# Patient Record
Sex: Female | Born: 2000
Health system: Southern US, Community
[De-identification: ages and names within clinical notes are randomized; demographics above are authoritative.]

## PROBLEM LIST (undated history)

## (undated) HISTORY — PX: MOUTH SURGERY: SHX715

---
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
## 2018-03-02 DIAGNOSIS — Z3042 Encounter for surveillance of injectable contraceptive: Secondary | ICD-10-CM | POA: Diagnosis not present

## 2018-03-02 DIAGNOSIS — Z3049 Encounter for surveillance of other contraceptives: Secondary | ICD-10-CM | POA: Diagnosis not present

## 2018-12-03 ENCOUNTER — Emergency Department: Payer: BC Managed Care – PPO

## 2018-12-03 ENCOUNTER — Emergency Department
Admission: EM | Admit: 2018-12-03 | Discharge: 2018-12-03 | Disposition: A | Discharge status: Home / Self Care | Payer: BC Managed Care – PPO | Attending: Emergency Medicine | Admitting: Emergency Medicine

## 2018-12-03 ENCOUNTER — Encounter: Payer: Self-pay | Admitting: Emergency Medicine

## 2018-12-03 ENCOUNTER — Other Ambulatory Visit: Payer: Self-pay

## 2018-12-03 DIAGNOSIS — Y939 Activity, unspecified: Secondary | ICD-10-CM | POA: Insufficient documentation

## 2018-12-03 DIAGNOSIS — M25512 Pain in left shoulder: Secondary | ICD-10-CM | POA: Diagnosis present

## 2018-12-03 DIAGNOSIS — Y999 Unspecified external cause status: Secondary | ICD-10-CM | POA: Insufficient documentation

## 2018-12-03 DIAGNOSIS — Y92410 Unspecified street and highway as the place of occurrence of the external cause: Secondary | ICD-10-CM | POA: Diagnosis not present

## 2018-12-03 DIAGNOSIS — S301XXA Contusion of abdominal wall, initial encounter: Secondary | ICD-10-CM | POA: Diagnosis not present

## 2018-12-03 MED ORDER — CYCLOBENZAPRINE HCL 5 MG PO TABS: ORAL_TABLET | ORAL | 0 refills | Status: AC

## 2018-12-03 MED ORDER — OXYCODONE-ACETAMINOPHEN 5-325 MG PO TABS
1.0000 | ORAL_TABLET | Freq: Once | ORAL | Status: AC
  Administered 2018-12-03: 22:00:00 1 via ORAL
  Filled 2018-12-03: qty 1

## 2018-12-03 MED ORDER — IBUPROFEN 600 MG PO TABS: 600.0000 mg | ORAL_TABLET | Freq: Four times a day (QID) | ORAL | 0 refills | Status: DC | PRN
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
## 2018-12-03 NOTE — ED Notes (Signed)
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
## 2019-01-09 ENCOUNTER — Other Ambulatory Visit: Payer: Self-pay

## 2019-01-09 DIAGNOSIS — Z20822 Contact with and (suspected) exposure to covid-19: Secondary | ICD-10-CM

## 2019-01-10 LAB — NOVEL CORONAVIRUS, NAA: SARS-CoV-2, NAA: NOT DETECTED

## 2019-05-08 ENCOUNTER — Other Ambulatory Visit: Payer: Self-pay

## 2019-05-08 ENCOUNTER — Encounter: Payer: Self-pay | Admitting: Emergency Medicine

## 2019-05-08 ENCOUNTER — Ambulatory Visit
Admission: EM | Admit: 2019-05-08 | Discharge: 2019-05-08 | Disposition: A | Discharge status: Home / Self Care | Payer: BC Managed Care – PPO

## 2019-05-08 DIAGNOSIS — S0033XA Contusion of nose, initial encounter: Secondary | ICD-10-CM | POA: Diagnosis not present

## 2019-05-08 DIAGNOSIS — Y9383 Activity, rough housing and horseplay: Secondary | ICD-10-CM

## 2019-05-08 MED ORDER — IBUPROFEN 600 MG PO TABS
600.0000 mg | ORAL_TABLET | Freq: Once | ORAL | Status: AC
  Administered 2019-05-08: 09:00:00 600 mg via ORAL

## 2019-05-08 MED ORDER — IBUPROFEN 600 MG PO TABS: 600.0000 mg | ORAL_TABLET | Freq: Four times a day (QID) | ORAL | 0 refills | Status: AC | PRN

## 2019-05-08 NOTE — Discharge Instructions (Addendum)
Apply ice packs.  Take ibuprofen as directed.    Follow up with Ear, Nose, & Throat if your symptoms are not improving.
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
## 2020-01-16 ENCOUNTER — Other Ambulatory Visit: Payer: Self-pay

## 2020-01-16 ENCOUNTER — Ambulatory Visit
Admission: EM | Admit: 2020-01-16 | Discharge: 2020-01-16 | Disposition: A | Discharge status: Home / Self Care | Payer: Commercial Managed Care - PPO | Attending: Family Medicine | Admitting: Family Medicine

## 2020-01-16 ENCOUNTER — Encounter: Payer: Self-pay | Admitting: Family Medicine

## 2020-01-16 DIAGNOSIS — N760 Acute vaginitis: Secondary | ICD-10-CM | POA: Insufficient documentation

## 2020-01-16 LAB — POCT URINALYSIS DIP (MANUAL ENTRY)
Bilirubin, UA: NEGATIVE
Glucose, UA: NEGATIVE mg/dL
Ketones, POC UA: NEGATIVE mg/dL
Nitrite, UA: NEGATIVE
Protein Ur, POC: NEGATIVE mg/dL
Spec Grav, UA: 1.025 (ref 1.010–1.025)
Urobilinogen, UA: 0.2 E.U./dL
pH, UA: 7 (ref 5.0–8.0)

## 2020-01-16 LAB — POCT URINE PREGNANCY: Preg Test, Ur: NEGATIVE

## 2020-01-16 MED ORDER — FLUCONAZOLE 150 MG PO TABS: 150.0000 mg | ORAL_TABLET | Freq: Every day | ORAL | 0 refills | Status: AC

## 2020-01-16 MED ORDER — NYSTATIN-TRIAMCINOLONE 100000-0.1 UNIT/GM-% EX CREA: TOPICAL_CREAM | CUTANEOUS | 0 refills | Status: AC

## 2020-01-16 NOTE — Discharge Instructions (Addendum)
Treated for a yeast infection.  Take the medication as prescribed. Swab sent for STD testing.

## 2020-01-16 NOTE — ED Provider Notes (Signed)
[name]    CSN: [id number] Arrival date & time: 01/16/20  1359      History   Chief Complaint Chief Complaint  Patient presents with  . Vaginal Itching    HPI Theresa Hines is a 19 y.o. female.   Patient is a 19 year old female who presents today with proximal 2 days of vaginal itching, irritation and swelling.  Tried over-the-counter Monistat without any relief.  Reporting worsening symptoms.  No specific discharge.  No dysuria, hematuria or urinary frequency.     History reviewed. No pertinent past medical history.  There are no problems to display for this patient.   Past Surgical History:  Procedure Laterality Date  . MOUTH SURGERY      OB History   No obstetric history on file.      Home Medications    Prior to Admission medications   Medication Sig Start Date End Date Taking? Authorizing Provider  cyclobenzaprine (FLEXERIL) 5 MG tablet Take 1-2 tablets 3 times daily as needed 12/03/18   Enid Derry, PA-C  fluconazole (DIFLUCAN) 150 MG tablet Take 1 tablet (150 mg total) by mouth daily. Take on tab now and one in 3 days if still having symptoms 01/16/20   Dahlia Byes A, NP  ibuprofen (ADVIL) 600 MG tablet Take 1 tablet (600 mg total) by mouth every 6 (six) hours as needed. 05/08/19   Mickie Bail, NP  medroxyPROGESTERone Acetate 150 MG/ML SUSY Inject into the muscle. 03/11/19   [provider]  nystatin-triamcinolone (MYCOLOG II) cream Apply to affected area daily 01/16/20   Janace Aris, NP    Family History Family History  Problem Relation Age of Onset  . Diabetes Mother   . Hypertension Father     Social History Social History   Tobacco Use  . Smoking status: Never Smoker  . Smokeless tobacco: Never Used  Vaping Use  . Vaping Use: Every day  . Substances: Nicotine, Flavoring  Substance Use Topics  . Alcohol use: Not Currently  . Drug use: Never     Allergies   No known allergies   Review of  Systems Review of Systems   Physical Exam Triage Vital Signs ED Triage Vitals  Enc Vitals Group     BP 01/16/20 1418 (S) (!) 152/104     Pulse Rate 01/16/20 1418 (!) 118     Resp 01/16/20 1418 16     Temp 01/16/20 1418 98.2 F (36.8 C)     Temp Source 01/16/20 1418 Temporal     SpO2 01/16/20 1418 99 %     Weight --      Height --      Head Circumference --      Peak Flow --      Pain Score 01/16/20 1433 0     Pain Loc --      Pain Edu? --      Excl. in GC? --    No data found.  Updated Vital Signs BP (S) (!) 152/104 (BP Location: Right Arm) Comment: baseline per pt report  Pulse (!) 118   Temp 98.2 F (36.8 C) (Temporal)   Resp 16   SpO2 99%   Visual Acuity Right Eye Distance:   Left Eye Distance:   Bilateral Distance:    Right Eye Near:   Left Eye Near:    Bilateral Near:     Physical Exam Vitals and nursing note reviewed.  Constitutional:      General: She  is not in acute distress.    Appearance: Normal appearance. She is not ill-appearing, toxic-appearing or diaphoretic.  HENT:     Head: Normocephalic.     Nose: Nose normal.  Eyes:     Conjunctiva/sclera: Conjunctivae normal.  Pulmonary:     Effort: Pulmonary effort is normal.  Musculoskeletal:        General: Normal range of motion.     Cervical back: Normal range of motion.  Skin:    General: Skin is warm and dry.     Findings: No rash.  Neurological:     Mental Status: She is alert.  Psychiatric:        Mood and Affect: Mood normal.      UC Treatments / Results  Labs (all labs ordered are listed, but only abnormal results are displayed) Labs Reviewed  POCT URINALYSIS DIP (MANUAL ENTRY) - Abnormal; Notable for the following components:      Result Value   Blood, UA small (*)    Leukocytes, UA Trace (*)    All other components within normal limits  URINE CULTURE  POCT URINE PREGNANCY  CERVICOVAGINAL ANCILLARY ONLY    EKG   Radiology No results found.  Procedures Procedures  (including critical care time)  Medications Ordered in UC Medications - No data to display  Initial Impression / Assessment and Plan / UC Course  I have reviewed the triage vital signs and the nursing notes.  Pertinent labs & imaging results that were available during my care of the patient were reviewed by me and considered in my medical decision making (see chart for details).     Vaginitis Most likely yeast infection based on symptoms.  Will monitor and treat for yeast infection today. Swab sent for further testing. Urine with trace leuks and small blood.  Sending for culture. Follow up as needed for continued or worsening symptoms  Final Clinical Impressions(s) / UC Diagnoses   Final diagnoses:  Vaginitis and vulvovaginitis     Discharge Instructions     Treated for a yeast infection.  Take the medication as prescribed. Swab sent for STD testing.     ED Prescriptions    Medication Sig Dispense Auth. Provider   nystatin-triamcinolone (MYCOLOG II) cream Apply to affected area daily 15 g Isabelly Kobler A, NP   fluconazole (DIFLUCAN) 150 MG tablet Take 1 tablet (150 mg total) by mouth daily. Take on tab now and one in 3 days if still having symptoms 2 tablet Jaci Lazier, Gearl Baratta A, NP     PDMP not reviewed this encounter.   Janace Aris, NP 01/16/20 1455

## 2020-01-16 NOTE — ED Triage Notes (Signed)
Patient presents to Urgent Care with complaints of vaginal itching and mild discharge since Tuesday. Patient reports some burning sensation to area and has noted swelling to site. Pt reports having unprotected sex concerned with possible STD.

## 2020-01-17 ENCOUNTER — Telehealth (HOSPITAL_COMMUNITY): Payer: Self-pay | Admitting: Emergency Medicine

## 2020-01-17 LAB — CERVICOVAGINAL ANCILLARY ONLY
Bacterial Vaginitis (gardnerella): POSITIVE — AB
Candida Glabrata: NEGATIVE
Candida Vaginitis: POSITIVE — AB
Chlamydia: NEGATIVE
Comment: NEGATIVE
Comment: NEGATIVE
Comment: NEGATIVE
Comment: NEGATIVE
Comment: NEGATIVE
Comment: NORMAL
Neisseria Gonorrhea: NEGATIVE
Trichomonas: NEGATIVE

## 2020-01-17 LAB — URINE CULTURE: Culture: <10000 — AB

## 2020-01-17 MED ORDER — METRONIDAZOLE 500 MG PO TABS: 500.0000 mg | ORAL_TABLET | Freq: Two times a day (BID) | ORAL | 0 refills | Status: DC

## 2020-01-27 ENCOUNTER — Telehealth: Payer: Self-pay | Admitting: Family Medicine

## 2020-01-27 MED ORDER — METRONIDAZOLE 0.75 % VA GEL: 1.0000 | Freq: Every day | VAGINAL | 0 refills | Status: AC

## 2020-01-27 NOTE — Telephone Encounter (Signed)
Switching from metronidazole oral to metronidazole vaginal suppository
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
## 2023-09-24 ENCOUNTER — Other Ambulatory Visit: Payer: Self-pay

## 2023-09-24 ENCOUNTER — Emergency Department
Admission: EM | Admit: 2023-09-24 | Discharge: 2023-09-24 | Disposition: A | Discharge status: Home / Self Care | Payer: Self-pay | Attending: Emergency Medicine | Admitting: Emergency Medicine

## 2023-09-24 ENCOUNTER — Emergency Department: Payer: Self-pay

## 2023-09-24 DIAGNOSIS — N83201 Unspecified ovarian cyst, right side: Secondary | ICD-10-CM | POA: Insufficient documentation

## 2023-09-24 DIAGNOSIS — R1031 Right lower quadrant pain: Secondary | ICD-10-CM

## 2023-09-24 DIAGNOSIS — R112 Nausea with vomiting, unspecified: Secondary | ICD-10-CM

## 2023-09-24 LAB — URINALYSIS, ROUTINE W REFLEX MICROSCOPIC
Bilirubin Urine: NEGATIVE
Glucose, UA: NEGATIVE mg/dL
Ketones, ur: NEGATIVE mg/dL
Nitrite: NEGATIVE
Protein, ur: 100 mg/dL — AB
Specific Gravity, Urine: 1.02 (ref 1.005–1.030)
Squamous Epithelial / HPF: >50 /HPF (ref 0–5)
pH: 8 (ref 5.0–8.0)

## 2023-09-24 LAB — COMPREHENSIVE METABOLIC PANEL WITH GFR
ALT: 17 U/L (ref 0–44)
AST: 19 U/L (ref 15–41)
Albumin: 4.2 g/dL (ref 3.5–5.0)
Alkaline Phosphatase: 56 U/L (ref 38–126)
Anion gap: 8 (ref 5–15)
BUN: <5 mg/dL — ABNORMAL LOW (ref 6–20)
CO2: 20 mmol/L — ABNORMAL LOW (ref 22–32)
Calcium: 9.4 mg/dL (ref 8.9–10.3)
Chloride: 109 mmol/L (ref 98–111)
Creatinine, Ser: 0.66 mg/dL (ref 0.44–1.00)
GFR, Estimated: >60 mL/min (ref >60)
Glucose, Bld: 90 mg/dL (ref 70–99)
Potassium: 3.3 mmol/L — ABNORMAL LOW (ref 3.5–5.1)
Sodium: 137 mmol/L (ref 135–145)
Total Bilirubin: 0.8 mg/dL (ref 0.0–1.2)
Total Protein: 7.7 g/dL (ref 6.5–8.1)

## 2023-09-24 LAB — CBC
HCT: 41.4 % (ref 36.0–46.0)
Hemoglobin: 14.3 g/dL (ref 12.0–15.0)
MCH: 29.6 pg (ref 26.0–34.0)
MCHC: 34.5 g/dL (ref 30.0–36.0)
MCV: 85.7 fL (ref 80.0–100.0)
Platelets: 255 K/uL (ref 150–400)
RBC: 4.83 MIL/uL (ref 3.87–5.11)
RDW: 12.4 % (ref 11.5–15.5)
WBC: 9 K/uL (ref 4.0–10.5)
nRBC: 0 % (ref 0.0–0.2)

## 2023-09-24 LAB — LIPASE, BLOOD: Lipase: 23 U/L (ref 11–51)

## 2023-09-24 LAB — POC URINE PREG, ED: Preg Test, Ur: NEGATIVE

## 2023-09-24 MED ORDER — MORPHINE SULFATE (PF) 2 MG/ML IV SOLN
2.0000 mg | Freq: Once | INTRAVENOUS | Status: AC
  Administered 2023-09-24: 2 mg via INTRAVENOUS
  Filled 2023-09-24: qty 1

## 2023-09-24 MED ORDER — ONDANSETRON 4 MG PO TBDP
4.0000 mg | ORAL_TABLET | Freq: Once | ORAL | Status: AC | PRN
  Administered 2023-09-24: 4 mg via ORAL
  Filled 2023-09-24: qty 1

## 2023-09-24 MED ORDER — ONDANSETRON 4 MG PO TBDP: 4.0000 mg | ORAL_TABLET | Freq: Three times a day (TID) | ORAL | 0 refills | Status: AC | PRN

## 2023-09-24 MED ORDER — ALUM & MAG HYDROXIDE-SIMETH 200-200-20 MG/5ML PO SUSP
30.0000 mL | Freq: Once | ORAL | Status: AC
  Administered 2023-09-24: 30 mL via ORAL
  Filled 2023-09-24: qty 30

## 2023-09-24 MED ORDER — IOHEXOL 300 MG/ML  SOLN
100.0000 mL | Freq: Once | INTRAMUSCULAR | Status: AC | PRN
  Administered 2023-09-24: 100 mL via INTRAVENOUS

## 2023-09-24 MED ORDER — PANTOPRAZOLE SODIUM 40 MG IV SOLR
40.0000 mg | Freq: Once | INTRAVENOUS | Status: AC
  Administered 2023-09-24: 40 mg via INTRAVENOUS
  Filled 2023-09-24: qty 10

## 2023-09-24 MED ORDER — PANTOPRAZOLE SODIUM 40 MG PO TBEC: 40.0000 mg | DELAYED_RELEASE_TABLET | ORAL | 1 refills | Status: AC

## 2023-09-24 MED ORDER — LIDOCAINE VISCOUS HCL 2 % MT SOLN
15.0000 mL | Freq: Once | OROMUCOSAL | Status: AC
  Administered 2023-09-24: 15 mL via ORAL
  Filled 2023-09-24: qty 15

## 2023-09-24 MED ORDER — SODIUM CHLORIDE 0.9 % IV BOLUS
1000.0000 mL | Freq: Once | INTRAVENOUS | Status: AC
  Administered 2023-09-24: 1000 mL via INTRAVENOUS

## 2023-09-24 NOTE — ED Provider Notes (Signed)
 [facility] Emergency Department Provider Note     Event Date/Time   First MD Initiated Contact with Patient 09/24/23 1409     (approximate)   History   Abdominal Pain and Emesis   HPI  Theresa Hines is a 23 y.o. female with no significant past medical history presents to the ED for evaluation of right lower abdominal pain and intermittent episodes of vomiting x Monday. Pain 8/10. OTC medications have not been able to help with pain.  Denies fever, urinary symptoms or sick contacts. No history of abdominal surgeries. No other complaint this time.    Physical Exam   Triage Vital Signs: ED Triage Vitals  Encounter Vitals Group     BP 09/24/23 1315 116/76     Girls Systolic BP Percentile --      Girls Diastolic BP Percentile --      Boys Systolic BP Percentile --      Boys Diastolic BP Percentile --      Pulse Rate 09/24/23 1315 99     Resp 09/24/23 1315 16     Temp 09/24/23 1315 98 F (36.7 C)     Temp Source 09/24/23 1315 Oral     SpO2 09/24/23 1315 100 %     Weight 09/24/23 1318 133 lb (60.3 kg)     Height 09/24/23 1318 5' (1.524 m)     Head Circumference --      Peak Flow --      Pain Score 09/24/23 1316 7     Pain Loc --      Pain Education --      Exclude from Growth Chart --     Most recent vital signs: Vitals:   09/24/23 1315 09/24/23 1720  BP: 116/76 120/77  Pulse: 99 88  Resp: 16   Temp: 98 F (36.7 C) 98 F (36.7 C)  SpO2: 100% 100%    General Nontoxic-appearing. Awake, no distress.  HEENT NCAT.  CV:  Good peripheral perfusion.  RESP:  Normal effort.  ABD:  No distention. Soft. Localized tenderness to RLQ.  ED Results / Procedures / Treatments   Labs (all labs ordered are listed, but only abnormal results are displayed) Labs Reviewed  COMPREHENSIVE METABOLIC PANEL WITH GFR - Abnormal; Notable for the following components:      Result Value   Potassium 3.3 (*)    CO2 20 (*)    BUN <5 (*)    All other  components within normal limits  URINALYSIS, ROUTINE W REFLEX MICROSCOPIC - Abnormal; Notable for the following components:   Color, Urine AMBER (*)    APPearance CLOUDY (*)    Hgb urine dipstick SMALL (*)    Protein, ur 100 (*)    Leukocytes,Ua TRACE (*)    Bacteria, UA MANY (*)    All other components within normal limits  LIPASE, BLOOD  CBC  POC URINE PREG, ED   RADIOLOGY  I personally viewed and evaluated these images as part of my medical decision making, as well as reviewing the written report by the radiologist.  CT ABDOMEN PELVIS W CONTRAST Result Date: 09/24/2023 CLINICAL DATA:  Right lower quadrant abdominal pain. Vomiting and diarrhea starting on Monday. EXAM: CT ABDOMEN AND PELVIS WITH CONTRAST TECHNIQUE: Multidetector CT imaging of the abdomen and pelvis was performed using the standard protocol following bolus administration of intravenous contrast. RADIATION DOSE REDUCTION: This exam was performed according to the departmental dose-optimization program which includes automated exposure control,  adjustment of the mA and/or kV according to patient size and/or use of iterative reconstruction technique. CONTRAST:  OMNIPAQUE  IOHEXOL  300 MG/ML  SOLN COMPARISON:  None Available. FINDINGS: Lower chest: Lung bases are clear. Hepatobiliary: No focal liver abnormality is seen. No gallstones, gallbladder wall thickening, or biliary dilatation. Pancreas: Unremarkable. No pancreatic ductal dilatation or surrounding inflammatory changes. Spleen: Normal in size without focal abnormality. Adrenals/Urinary Tract: Adrenal glands are unremarkable. Kidneys are normal, without renal calculi, focal lesion, or hydronephrosis. Bladder is unremarkable. Stomach/Bowel: Stomach, small bowel, and colon are not abnormally distended. No wall thickening or inflammatory stranding identified. Appendix is normal. Vascular/Lymphatic: No significant vascular findings are present. No enlarged abdominal or pelvic  lymph nodes. Reproductive: Uterus is retroverted. No masses are identified. 2.6 cm simple appearing cyst on the right ovary is likely physiologic. No imaging follow-up is indicated. Left ovary is normal. Other: No abdominal wall hernia or abnormality. No abdominopelvic ascites. Musculoskeletal: No acute or significant osseous findings. IMPRESSION: 1. No acute process demonstrated in the abdomen or pelvis. No evidence of bowel obstruction or inflammation. Appendix is normal. 2. 2.6 cm right ovarian cyst. No follow-up imaging recommended. Note: This recommendation does not apply to premenarchal patients and to those with increased risk (genetic, family history, elevated tumor markers or other high-risk factors) of ovarian cancer. Reference: JACR 2020 Feb; 17(2):248-254 Electronically Signed   By: Elsie Gravely M.D.   On: 09/24/2023 16:42    PROCEDURES:  Critical Care performed: No  Procedures   MEDICATIONS ORDERED IN ED: Medications  ondansetron  (ZOFRAN -ODT) disintegrating tablet 4 mg (4 mg Oral Given 09/24/23 1323)  sodium chloride  0.9 % bolus 1,000 mL (0 mLs Intravenous Stopped 09/24/23 1653)  morphine  (PF) 2 MG/ML injection 2 mg (2 mg Intravenous Given 09/24/23 1501)  iohexol  (OMNIPAQUE ) 300 MG/ML solution 100 mL (100 mLs Intravenous Contrast Given 09/24/23 1546)  alum & mag hydroxide-simeth (MAALOX/MYLANTA) 200-200-20 MG/5ML suspension 30 mL (30 mLs Oral Given 09/24/23 1705)    And  lidocaine  (XYLOCAINE ) 2 % viscous mouth solution 15 mL (15 mLs Oral Given 09/24/23 1705)  pantoprazole  (PROTONIX ) injection 40 mg (40 mg Intravenous Given 09/24/23 1705)     IMPRESSION / MDM / ASSESSMENT AND PLAN / ED COURSE  I reviewed the triage vital signs and the nursing notes.                               23 y.o. female presents to the emergency department for evaluation and treatment of acute right lower quadrant pain. See HPI for further details.   Differential diagnosis includes, but is not limited  to, ovarian cyst, ovarian torsion, acute appendicitis, diverticulitis, urinary tract infection/pyelonephritis, endometriosis, bowel obstruction, colitis, renal colic, gastroenteritis, hernia, fibroids, endometriosis, pregnancy related pain including ectopic pregnancy, etc.   Patient's presentation is most consistent with acute complicated illness / injury requiring diagnostic workup.  Patient is alert and oriented.  She is hemodynamically stable.  Physical exam findings are stated above.  Pertinent for right lower quadrant tenderness.  Administer IV fluid and morphine  for pain.  CT abdomen and pelvis reveal right ovarian cyst.  This is likely contributing to patient's presentation.  Advise follow-up with OB/GYN.  The patient is in stable and satisfactory condition for discharge home. ED precautions discussed. All questions and concerns were addressed during this ED visit.     FINAL CLINICAL IMPRESSION(S) / ED DIAGNOSES   Final diagnoses:  Right lower quadrant abdominal  pain  Right ovarian cyst  Nausea and vomiting, unspecified vomiting type   Rx / DC Orders   ED Discharge Orders          Ordered    ondansetron  (ZOFRAN -ODT) 4 MG disintegrating tablet  Every 8 hours PRN        09/24/23 1657    pantoprazole  (PROTONIX ) 40 MG tablet  BH-each morning        09/24/23 1657           Note:  This document was prepared using Dragon voice recognition software and may include unintentional dictation errors.    Margrette, Zyaira Vejar A, PA-C 09/24/23 1833    Arlander Charleston, MD 09/24/23 RONOLD

## 2023-09-24 NOTE — ED Triage Notes (Addendum)
 Pt arrives via POV with c/o abd pain & emesis & diarrhea that started on Monday. Pt states that they have vomited 6x/day since Monday. Pt states that the pain got so bad they decided to come get seen. Pt endorses marijuana use, every other day because that is what helps their pain because they can't keep tylenol  down. Pt is A&Ox4 during triage.

## 2023-09-24 NOTE — Discharge Instructions (Addendum)
 You were evaluated in the ED for right lower abdomen pain.  Your lab work is reassuring.  Your CT scan shows a retroverted uterus and 2.6 cm simple appearing cyst on the right ovary likely physiologic.  Follow-up with your primary care provider for further evaluation.
